# Patient Record
Sex: Female | Born: 1950 | Race: White | Hispanic: No | Marital: Married
Health system: Southern US, Community
[De-identification: ages and names within clinical notes are randomized; demographics above are authoritative.]

## PROBLEM LIST (undated history)

## (undated) DIAGNOSIS — I10 Essential (primary) hypertension: Secondary | ICD-10-CM

## (undated) HISTORY — DX: Essential (primary) hypertension: I10

## (undated) HISTORY — PX: TONSILLECTOMY: SUR1361

---

## 1999-07-27 ENCOUNTER — Encounter (INDEPENDENT_AMBULATORY_CARE_PROVIDER_SITE_OTHER): Payer: Self-pay | Admitting: Specialist

## 1999-07-27 ENCOUNTER — Other Ambulatory Visit: Admission: RE | Admit: 1999-07-27 | Discharge: 1999-07-27 | Payer: Self-pay | Admitting: Obstetrics and Gynecology

## 2001-11-05 ENCOUNTER — Other Ambulatory Visit: Admission: RE | Admit: 2001-11-05 | Discharge: 2001-11-05 | Payer: Self-pay | Admitting: Obstetrics and Gynecology

## 2001-11-30 ENCOUNTER — Encounter: Payer: Self-pay | Admitting: Obstetrics and Gynecology

## 2001-11-30 ENCOUNTER — Encounter: Admission: RE | Admit: 2001-11-30 | Discharge: 2001-11-30 | Payer: Self-pay | Admitting: Obstetrics and Gynecology

## 2002-08-20 ENCOUNTER — Encounter (INDEPENDENT_AMBULATORY_CARE_PROVIDER_SITE_OTHER): Payer: Self-pay | Admitting: *Deleted

## 2002-08-20 ENCOUNTER — Ambulatory Visit (HOSPITAL_COMMUNITY): Admission: RE | Admit: 2002-08-20 | Discharge: 2002-08-20 | Payer: Self-pay | Admitting: Obstetrics and Gynecology

## 2003-01-10 ENCOUNTER — Encounter: Admission: RE | Admit: 2003-01-10 | Discharge: 2003-01-10 | Payer: Self-pay | Admitting: Obstetrics and Gynecology

## 2003-01-10 ENCOUNTER — Encounter: Payer: Self-pay | Admitting: Obstetrics and Gynecology

## 2003-01-17 ENCOUNTER — Other Ambulatory Visit: Admission: RE | Admit: 2003-01-17 | Discharge: 2003-01-17 | Payer: Self-pay | Admitting: Obstetrics and Gynecology

## 2003-02-14 ENCOUNTER — Encounter: Payer: Self-pay | Admitting: Obstetrics and Gynecology

## 2003-02-14 ENCOUNTER — Encounter: Admission: RE | Admit: 2003-02-14 | Discharge: 2003-02-14 | Payer: Self-pay | Admitting: Obstetrics and Gynecology

## 2006-03-27 ENCOUNTER — Other Ambulatory Visit: Admission: RE | Admit: 2006-03-27 | Discharge: 2006-03-27 | Payer: Self-pay | Admitting: Obstetrics and Gynecology

## 2006-04-14 ENCOUNTER — Encounter: Admission: RE | Admit: 2006-04-14 | Discharge: 2006-04-14 | Payer: Self-pay | Admitting: Obstetrics and Gynecology

## 2007-05-22 ENCOUNTER — Other Ambulatory Visit: Admission: RE | Admit: 2007-05-22 | Discharge: 2007-05-22 | Payer: Self-pay | Admitting: Obstetrics and Gynecology

## 2007-07-12 ENCOUNTER — Encounter: Admission: RE | Admit: 2007-07-12 | Discharge: 2007-07-12 | Payer: Self-pay | Admitting: Obstetrics and Gynecology

## 2008-09-09 ENCOUNTER — Other Ambulatory Visit: Admission: RE | Admit: 2008-09-09 | Discharge: 2008-09-09 | Payer: Self-pay | Admitting: Obstetrics and Gynecology

## 2008-11-21 ENCOUNTER — Encounter: Admission: RE | Admit: 2008-11-21 | Discharge: 2008-11-21 | Payer: Self-pay | Admitting: Obstetrics and Gynecology

## 2011-02-11 NOTE — Op Note (Signed)
NAME:  Tamara Edwards, Tamara Edwards                     ACCOUNT NO.:  [id number]   MEDICAL RECORD NO.:  [id number]                   PATIENT TYPE:  AMB   LOCATION:  SDC                                  FACILITY:  [facility]   PHYSICIAN:  Laqueta Linden, M.D.                 DATE OF BIRTH:  02/07/1951   DATE OF PROCEDURE:  08/20/2002  DATE OF DISCHARGE:                                 OPERATIVE REPORT   PREOPERATIVE DIAGNOSES:  Postmenopausal bleeding due to endometrial polyps.   POSTOPERATIVE DIAGNOSES:  Postmenopausal bleeding due to endometrial polyps,  multiple polyps.   PROCEDURE:  Hysteroscopic resection with rollerball ablation.   SURGEON:  Laqueta Linden, M.D.   ANESTHESIA:  General LMA.   ESTIMATED BLOOD LOSS:  Less than 20 cc.   FLUIDS:  Sorbitol net intake 20 cc.   COMPLICATIONS:  None.   INDICATIONS:  The patient is a 60 year old menopausal female initially on  hormone replacement therapy who had the onset of postmenopausal bleeding.  She stopped her hormone replacement therapy and had persistent bleeding.  She underwent pelvic ultrasound with sonohysterogram which revealed one and  possibly two endometrial polyps felt to be the source of her bleeding.  She  was treated with progesterone therapy which was little help.  She has had  intermittently very heavy bleeding and continuous bleeding for several  months.  She is to undergo hysteroscopic resection.  She has seen the  informed consent film, voiced her understanding and acceptance of all risks,  benefits, complications, and recovery expectations and agrees to proceed.   PROCEDURE:  The patient was taken to the operating room and after proper  identification and consents were ascertained, she was placed on the  operating table in supine position.  After the induction of general LMA  anesthesia she was placed in the [place] stirrups and the perineum and vagina  were prepped and draped in a sterile fashion.  A transurethral Foley  was  placed which was removed at the conclusion of the procedure.  Bimanual  examination confirmed a mid plane mobile uterus.  The speculum was placed in  the vagina and the cervix grasped with a single tooth tenaculum.  The  internal os was patent to a sound and was sounded to 9 cm.  The internal os  was then gently dilated to a number 33 Pratt dilator.  The resectoscope with  continuous sorbitol infusion was then inserted under direct video  observation.  The endocervical canal was free of lesions.  The endometrial  cavity was notable for a multitude of polyps.  There was one large broad  based polyp extending down from the fundus.  There were multiple clusters of  polyps on the anterior right uterine wall as well as the left posterior  uterine wall.  Photographs were taken for pre resection confirmation of  findings.  The resectoscope with double loop was then placed on routine  settings and all of the above mentioned polyps were resected.  The remainder  of the endometrial cavity was resected as well as there were multiple  irregularities suspicious for additional polyps.  There were several small  bleeding points noted.  The rollerball attachment was then attached and the  entire cavity was then ablated in an effort to decrease postoperative  bleeding as well as prevent reformation of polyps.  Post rollerball  photograph was taken as well.  All instruments and tissue pieces were  evacuated.  The tissue was sent to pathology.  The tenaculum site was  hemostatic.  There was no bleeding from the cervix.  Net sorbitol intake was  less than 20 cc.  Estimated blood loss less than 20 cc.  The patient was  stable on transfer to the recovery room.  She received Toradol 30 mg IV, 30  mg IM prior to conclusion of the procedure.  She will be observed and  discharged per anesthesia protocol.  She has routine verbal and written  discharge instructions.  She was to take Advil or Aleve as needed for   cramping and continue her Prometrium daily for the next week and then stop  this.  She is scheduled to follow up in my office in five weeks' time.  She  is to call prior to that time for excessive pain, fever, bleeding, abdominal  complaints, or any other concerns.                                               Laqueta Linden, M.D.    LKS/MEDQ  D:  08/20/2002  T:  08/20/2002  Job:  [id number]

## 2013-07-05 ENCOUNTER — Other Ambulatory Visit: Payer: Self-pay

## 2013-07-05 DIAGNOSIS — Z1231 Encounter for screening mammogram for malignant neoplasm of breast: Secondary | ICD-10-CM

## 2013-07-29 ENCOUNTER — Ambulatory Visit
Admission: RE | Admit: 2013-07-29 | Discharge: 2013-07-29 | Disposition: A | Discharge status: Home / Self Care | Payer: No Typology Code available for payment source | Source: Ambulatory Visit

## 2013-07-29 DIAGNOSIS — Z1231 Encounter for screening mammogram for malignant neoplasm of breast: Secondary | ICD-10-CM

## 2016-02-02 DIAGNOSIS — H5213 Myopia, bilateral: Secondary | ICD-10-CM | POA: Diagnosis not present

## 2016-02-02 DIAGNOSIS — H2513 Age-related nuclear cataract, bilateral: Secondary | ICD-10-CM | POA: Diagnosis not present

## 2016-02-02 DIAGNOSIS — H353122 Nonexudative age-related macular degeneration, left eye, intermediate dry stage: Secondary | ICD-10-CM | POA: Diagnosis not present

## 2016-02-04 DIAGNOSIS — I10 Essential (primary) hypertension: Secondary | ICD-10-CM | POA: Diagnosis not present

## 2016-02-04 DIAGNOSIS — K219 Gastro-esophageal reflux disease without esophagitis: Secondary | ICD-10-CM | POA: Diagnosis not present

## 2016-02-04 DIAGNOSIS — F419 Anxiety disorder, unspecified: Secondary | ICD-10-CM | POA: Diagnosis not present

## 2016-02-04 DIAGNOSIS — E7800 Pure hypercholesterolemia, unspecified: Secondary | ICD-10-CM | POA: Diagnosis not present

## 2016-06-30 DIAGNOSIS — Z23 Encounter for immunization: Secondary | ICD-10-CM | POA: Diagnosis not present

## 2016-08-10 DIAGNOSIS — I10 Essential (primary) hypertension: Secondary | ICD-10-CM | POA: Diagnosis not present

## 2016-08-10 DIAGNOSIS — E7800 Pure hypercholesterolemia, unspecified: Secondary | ICD-10-CM | POA: Diagnosis not present

## 2016-08-10 DIAGNOSIS — F419 Anxiety disorder, unspecified: Secondary | ICD-10-CM | POA: Diagnosis not present

## 2016-08-10 DIAGNOSIS — K219 Gastro-esophageal reflux disease without esophagitis: Secondary | ICD-10-CM | POA: Diagnosis not present

## 2017-02-06 DIAGNOSIS — H524 Presbyopia: Secondary | ICD-10-CM | POA: Diagnosis not present

## 2017-02-17 DIAGNOSIS — I10 Essential (primary) hypertension: Secondary | ICD-10-CM | POA: Diagnosis not present

## 2017-02-17 DIAGNOSIS — F419 Anxiety disorder, unspecified: Secondary | ICD-10-CM | POA: Diagnosis not present

## 2017-02-17 DIAGNOSIS — E7800 Pure hypercholesterolemia, unspecified: Secondary | ICD-10-CM | POA: Diagnosis not present

## 2017-02-17 DIAGNOSIS — Z1159 Encounter for screening for other viral diseases: Secondary | ICD-10-CM | POA: Diagnosis not present

## 2017-02-17 DIAGNOSIS — K219 Gastro-esophageal reflux disease without esophagitis: Secondary | ICD-10-CM | POA: Diagnosis not present

## 2017-06-26 DIAGNOSIS — H3561 Retinal hemorrhage, right eye: Secondary | ICD-10-CM | POA: Diagnosis not present

## 2017-06-26 DIAGNOSIS — H35031 Hypertensive retinopathy, right eye: Secondary | ICD-10-CM | POA: Diagnosis not present

## 2017-06-26 DIAGNOSIS — H353122 Nonexudative age-related macular degeneration, left eye, intermediate dry stage: Secondary | ICD-10-CM | POA: Diagnosis not present

## 2017-08-02 DIAGNOSIS — Z23 Encounter for immunization: Secondary | ICD-10-CM | POA: Diagnosis not present

## 2017-09-13 DIAGNOSIS — E7800 Pure hypercholesterolemia, unspecified: Secondary | ICD-10-CM | POA: Diagnosis not present

## 2017-09-13 DIAGNOSIS — F419 Anxiety disorder, unspecified: Secondary | ICD-10-CM | POA: Diagnosis not present

## 2017-09-13 DIAGNOSIS — K219 Gastro-esophageal reflux disease without esophagitis: Secondary | ICD-10-CM | POA: Diagnosis not present

## 2017-09-13 DIAGNOSIS — I10 Essential (primary) hypertension: Secondary | ICD-10-CM | POA: Diagnosis not present

## 2017-09-13 DIAGNOSIS — Z1211 Encounter for screening for malignant neoplasm of colon: Secondary | ICD-10-CM | POA: Diagnosis not present

## 2018-02-28 ENCOUNTER — Other Ambulatory Visit: Payer: Self-pay | Admitting: Family Medicine

## 2018-02-28 DIAGNOSIS — Z1231 Encounter for screening mammogram for malignant neoplasm of breast: Secondary | ICD-10-CM

## 2018-03-21 ENCOUNTER — Encounter: Payer: Self-pay | Admitting: Radiology

## 2018-03-21 ENCOUNTER — Ambulatory Visit
Admission: RE | Admit: 2018-03-21 | Discharge: 2018-03-21 | Disposition: A | Discharge status: Home / Self Care | Payer: Medicare Other | Source: Ambulatory Visit | Attending: Family Medicine | Admitting: Family Medicine

## 2018-03-21 DIAGNOSIS — Z1231 Encounter for screening mammogram for malignant neoplasm of breast: Secondary | ICD-10-CM | POA: Diagnosis not present

## 2018-04-25 DIAGNOSIS — E7800 Pure hypercholesterolemia, unspecified: Secondary | ICD-10-CM | POA: Diagnosis not present

## 2018-04-25 DIAGNOSIS — F419 Anxiety disorder, unspecified: Secondary | ICD-10-CM | POA: Diagnosis not present

## 2018-04-25 DIAGNOSIS — I10 Essential (primary) hypertension: Secondary | ICD-10-CM | POA: Diagnosis not present

## 2018-04-25 DIAGNOSIS — K219 Gastro-esophageal reflux disease without esophagitis: Secondary | ICD-10-CM | POA: Diagnosis not present

## 2018-07-18 DIAGNOSIS — Z23 Encounter for immunization: Secondary | ICD-10-CM | POA: Diagnosis not present

## 2018-10-30 DIAGNOSIS — I10 Essential (primary) hypertension: Secondary | ICD-10-CM | POA: Diagnosis not present

## 2018-10-30 DIAGNOSIS — E7800 Pure hypercholesterolemia, unspecified: Secondary | ICD-10-CM | POA: Diagnosis not present

## 2018-10-30 DIAGNOSIS — F411 Generalized anxiety disorder: Secondary | ICD-10-CM | POA: Diagnosis not present

## 2019-05-21 DIAGNOSIS — E7800 Pure hypercholesterolemia, unspecified: Secondary | ICD-10-CM | POA: Diagnosis not present

## 2019-05-21 DIAGNOSIS — I10 Essential (primary) hypertension: Secondary | ICD-10-CM | POA: Diagnosis not present

## 2019-05-21 DIAGNOSIS — F419 Anxiety disorder, unspecified: Secondary | ICD-10-CM | POA: Diagnosis not present

## 2019-05-21 DIAGNOSIS — Z1211 Encounter for screening for malignant neoplasm of colon: Secondary | ICD-10-CM | POA: Diagnosis not present

## 2019-06-25 DIAGNOSIS — Z23 Encounter for immunization: Secondary | ICD-10-CM | POA: Diagnosis not present

## 2019-07-31 DIAGNOSIS — H2513 Age-related nuclear cataract, bilateral: Secondary | ICD-10-CM | POA: Diagnosis not present

## 2019-07-31 DIAGNOSIS — H35722 Serous detachment of retinal pigment epithelium, left eye: Secondary | ICD-10-CM | POA: Diagnosis not present

## 2019-07-31 DIAGNOSIS — H524 Presbyopia: Secondary | ICD-10-CM | POA: Diagnosis not present

## 2019-09-04 DIAGNOSIS — H25813 Combined forms of age-related cataract, bilateral: Secondary | ICD-10-CM | POA: Diagnosis not present

## 2019-09-04 DIAGNOSIS — H02831 Dermatochalasis of right upper eyelid: Secondary | ICD-10-CM | POA: Diagnosis not present

## 2019-09-04 DIAGNOSIS — H35342 Macular cyst, hole, or pseudohole, left eye: Secondary | ICD-10-CM | POA: Diagnosis not present

## 2019-09-04 DIAGNOSIS — H52203 Unspecified astigmatism, bilateral: Secondary | ICD-10-CM | POA: Diagnosis not present

## 2019-09-14 DIAGNOSIS — Z01818 Encounter for other preprocedural examination: Secondary | ICD-10-CM | POA: Diagnosis not present

## 2019-09-17 DIAGNOSIS — H25813 Combined forms of age-related cataract, bilateral: Secondary | ICD-10-CM | POA: Diagnosis not present

## 2019-09-17 DIAGNOSIS — E785 Hyperlipidemia, unspecified: Secondary | ICD-10-CM | POA: Diagnosis not present

## 2019-09-17 DIAGNOSIS — H25811 Combined forms of age-related cataract, right eye: Secondary | ICD-10-CM | POA: Diagnosis not present

## 2019-09-17 DIAGNOSIS — Z83511 Family history of glaucoma: Secondary | ICD-10-CM | POA: Diagnosis not present

## 2019-09-17 DIAGNOSIS — I10 Essential (primary) hypertension: Secondary | ICD-10-CM | POA: Diagnosis not present

## 2019-09-21 DIAGNOSIS — Z01818 Encounter for other preprocedural examination: Secondary | ICD-10-CM | POA: Diagnosis not present

## 2019-09-24 DIAGNOSIS — E785 Hyperlipidemia, unspecified: Secondary | ICD-10-CM | POA: Diagnosis not present

## 2019-09-24 DIAGNOSIS — H25812 Combined forms of age-related cataract, left eye: Secondary | ICD-10-CM | POA: Diagnosis not present

## 2019-09-24 DIAGNOSIS — H25813 Combined forms of age-related cataract, bilateral: Secondary | ICD-10-CM | POA: Diagnosis not present

## 2019-09-24 DIAGNOSIS — I10 Essential (primary) hypertension: Secondary | ICD-10-CM | POA: Diagnosis not present

## 2019-09-24 DIAGNOSIS — K219 Gastro-esophageal reflux disease without esophagitis: Secondary | ICD-10-CM | POA: Diagnosis not present

## 2019-10-18 DIAGNOSIS — H59031 Cystoid macular edema following cataract surgery, right eye: Secondary | ICD-10-CM | POA: Diagnosis not present

## 2019-10-18 DIAGNOSIS — Z961 Presence of intraocular lens: Secondary | ICD-10-CM | POA: Diagnosis not present

## 2019-11-17 ENCOUNTER — Ambulatory Visit: Payer: Medicare Other | Attending: Internal Medicine

## 2019-11-17 DIAGNOSIS — Z23 Encounter for immunization: Secondary | ICD-10-CM | POA: Insufficient documentation

## 2019-11-17 NOTE — Progress Notes (Signed)
   Covid-19 Vaccination Clinic  Name:  SHANTINIQUE PICAZO    MRN: [id number] DOB: 1951-05-06  11/17/2019  Ms. Albin was observed post Covid-19 immunization for 15 minutes without incidence. She was provided with Vaccine Information Sheet and instruction to access the V-Safe system.   Ms. Fons was instructed to call 911 with any severe reactions post vaccine: Marland Kitchen Difficulty breathing  . Swelling of your face and throat  . A fast heartbeat  . A bad rash all over your body  . Dizziness and weakness    Immunizations Administered    Name Date Dose VIS Date Route   Pfizer COVID-19 Vaccine 11/17/2019 10:01 AM 0.3 mL 09/06/2019 Intramuscular   Manufacturer: [employer], [employer]   Lot: [id number]   NDC: [address]-1

## 2019-11-21 DIAGNOSIS — Z0000 Encounter for general adult medical examination without abnormal findings: Secondary | ICD-10-CM | POA: Diagnosis not present

## 2019-11-21 DIAGNOSIS — E7800 Pure hypercholesterolemia, unspecified: Secondary | ICD-10-CM | POA: Diagnosis not present

## 2019-11-21 DIAGNOSIS — F419 Anxiety disorder, unspecified: Secondary | ICD-10-CM | POA: Diagnosis not present

## 2019-11-21 DIAGNOSIS — I10 Essential (primary) hypertension: Secondary | ICD-10-CM | POA: Diagnosis not present

## 2019-11-25 DIAGNOSIS — H35341 Macular cyst, hole, or pseudohole, right eye: Secondary | ICD-10-CM | POA: Diagnosis not present

## 2019-11-28 ENCOUNTER — Other Ambulatory Visit: Payer: Self-pay | Admitting: Physician Assistant

## 2019-11-28 DIAGNOSIS — Z1231 Encounter for screening mammogram for malignant neoplasm of breast: Secondary | ICD-10-CM

## 2019-11-28 DIAGNOSIS — Z13820 Encounter for screening for osteoporosis: Secondary | ICD-10-CM

## 2019-12-11 ENCOUNTER — Ambulatory Visit: Payer: Medicare Other | Attending: Internal Medicine

## 2019-12-11 DIAGNOSIS — Z23 Encounter for immunization: Secondary | ICD-10-CM

## 2019-12-11 NOTE — Progress Notes (Signed)
   Covid-19 Vaccination Clinic  Name:  JULISA FLIPPO    MRN: [id number] DOB: 09/30/1950  12/11/2019  Ms. Lovett was observed post Covid-19 immunization for 15 minutes without incident. She was provided with Vaccine Information Sheet and instruction to access the V-Safe system.   Ms. Saville was instructed to call 911 with any severe reactions post vaccine: Marland Kitchen Difficulty breathing  . Swelling of face and throat  . A fast heartbeat  . A bad rash all over body  . Dizziness and weakness   Immunizations Administered    Name Date Dose VIS Date Route   Pfizer COVID-19 Vaccine 12/11/2019  9:56 AM 0.3 mL 09/06/2019 Intramuscular   Manufacturer: [employer], [employer]   Lot: [id number]   NDC: [address]-2

## 2019-12-25 DIAGNOSIS — Z0000 Encounter for general adult medical examination without abnormal findings: Secondary | ICD-10-CM | POA: Diagnosis not present

## 2020-03-10 ENCOUNTER — Ambulatory Visit
Admission: RE | Admit: 2020-03-10 | Discharge: 2020-03-10 | Disposition: A | Discharge status: Home / Self Care | Payer: Medicare Other | Source: Ambulatory Visit | Attending: Physician Assistant | Admitting: Physician Assistant

## 2020-03-10 ENCOUNTER — Other Ambulatory Visit: Payer: Self-pay

## 2020-03-10 DIAGNOSIS — M8589 Other specified disorders of bone density and structure, multiple sites: Secondary | ICD-10-CM | POA: Diagnosis not present

## 2020-03-10 DIAGNOSIS — Z13820 Encounter for screening for osteoporosis: Secondary | ICD-10-CM

## 2020-03-10 DIAGNOSIS — Z1231 Encounter for screening mammogram for malignant neoplasm of breast: Secondary | ICD-10-CM

## 2020-03-10 DIAGNOSIS — Z780 Asymptomatic menopausal state: Secondary | ICD-10-CM | POA: Diagnosis not present

## 2020-07-06 DIAGNOSIS — Z23 Encounter for immunization: Secondary | ICD-10-CM | POA: Diagnosis not present

## 2020-12-15 DIAGNOSIS — I10 Essential (primary) hypertension: Secondary | ICD-10-CM | POA: Diagnosis not present

## 2020-12-15 DIAGNOSIS — E7800 Pure hypercholesterolemia, unspecified: Secondary | ICD-10-CM | POA: Diagnosis not present

## 2021-04-07 IMAGING — MG DIGITAL SCREENING BILAT W/ TOMO W/ CAD
8 series · 8 of 24 positions shown · non-contrast
Comparison: Previous exam(s).

CLINICAL DATA: Screening.

EXAM:
DIGITAL SCREENING BILATERAL MAMMOGRAM WITH TOMO AND CAD

[R CC synth-2D]
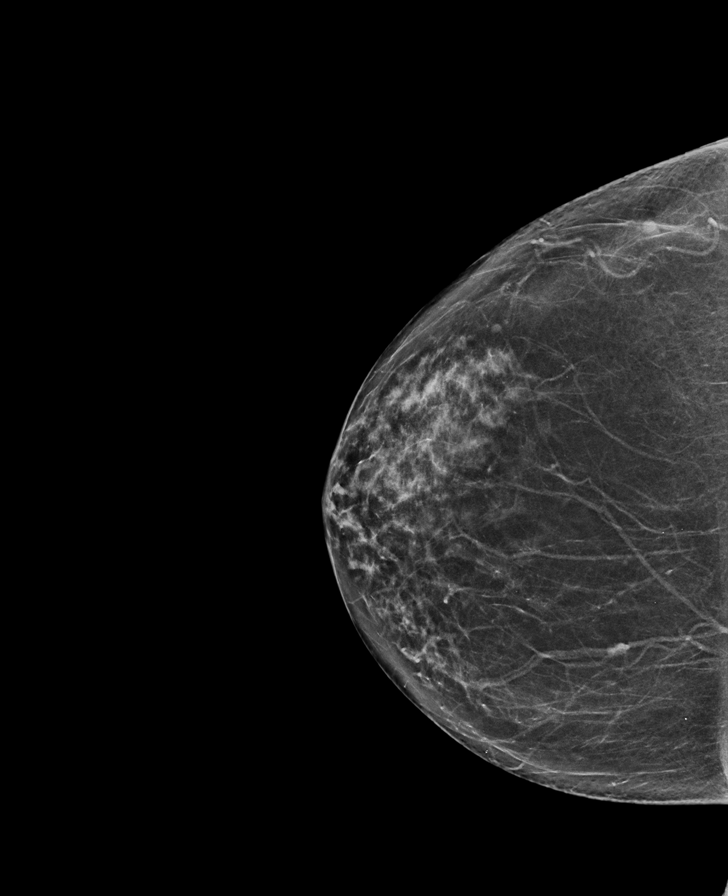

[L MLO synth-2D]
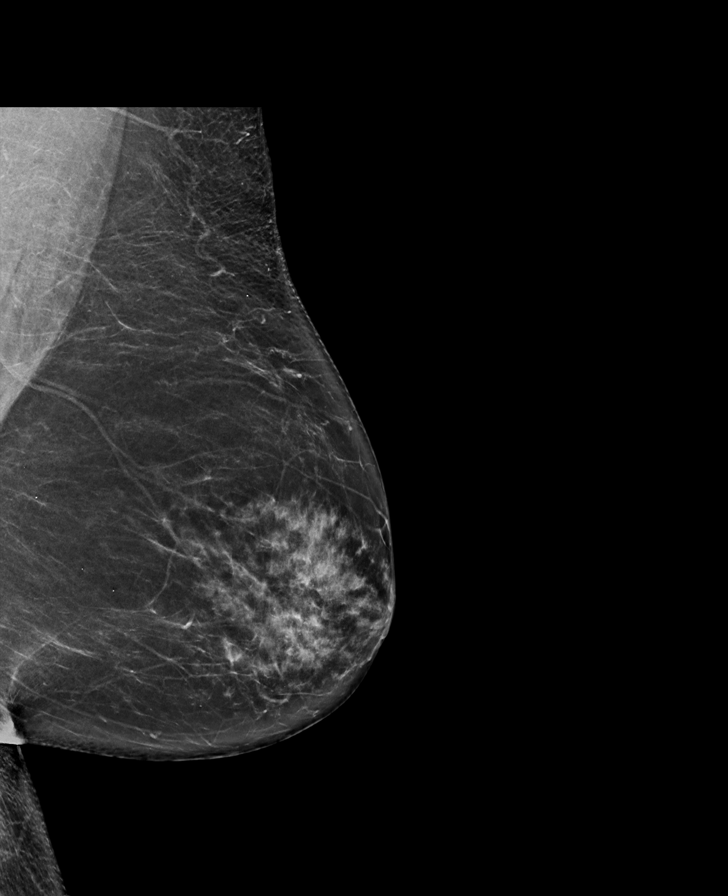

[L CC synth-2D]
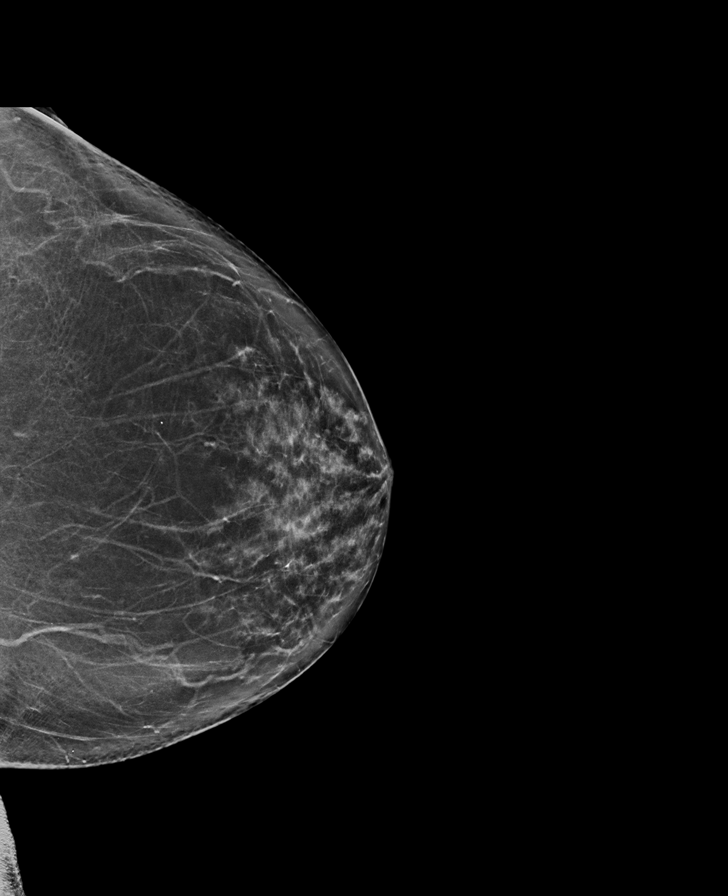

[R MLO synth-2D]
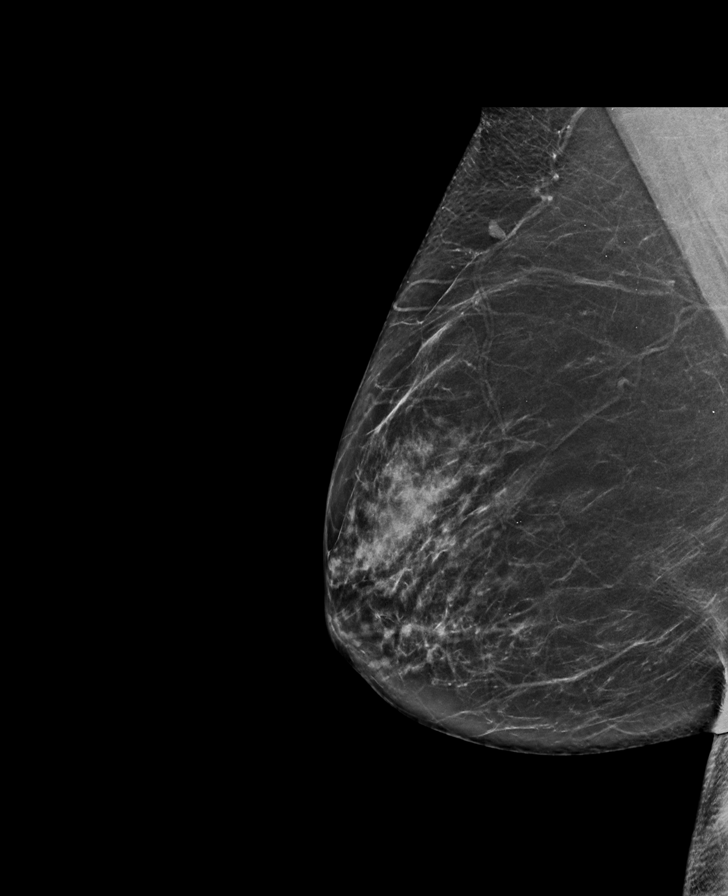

[R CC tomo · tomo slice 35/68.0]
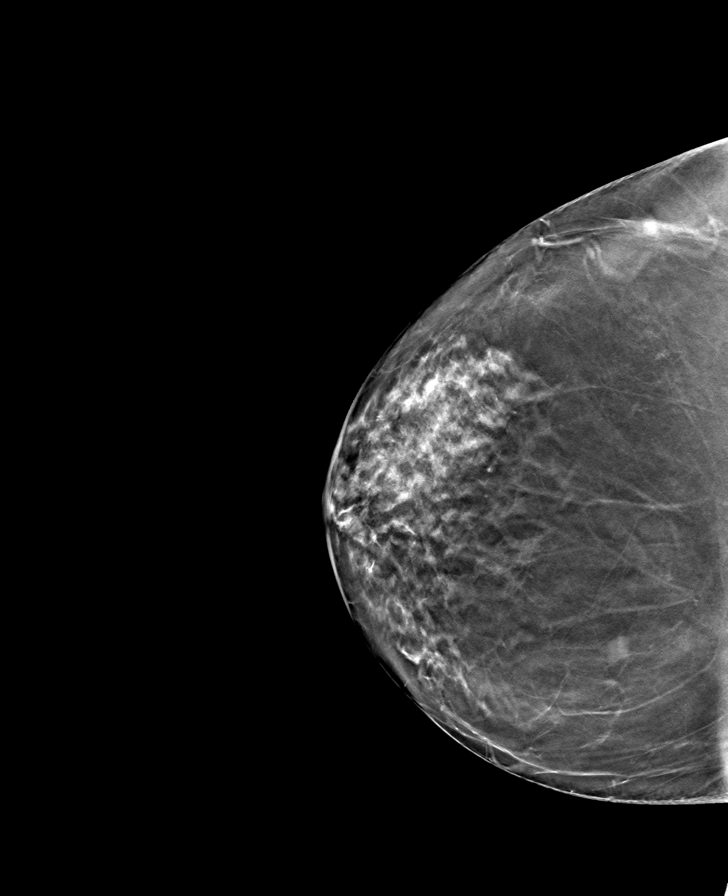

[R MLO tomo · tomo slice 37/74.0]
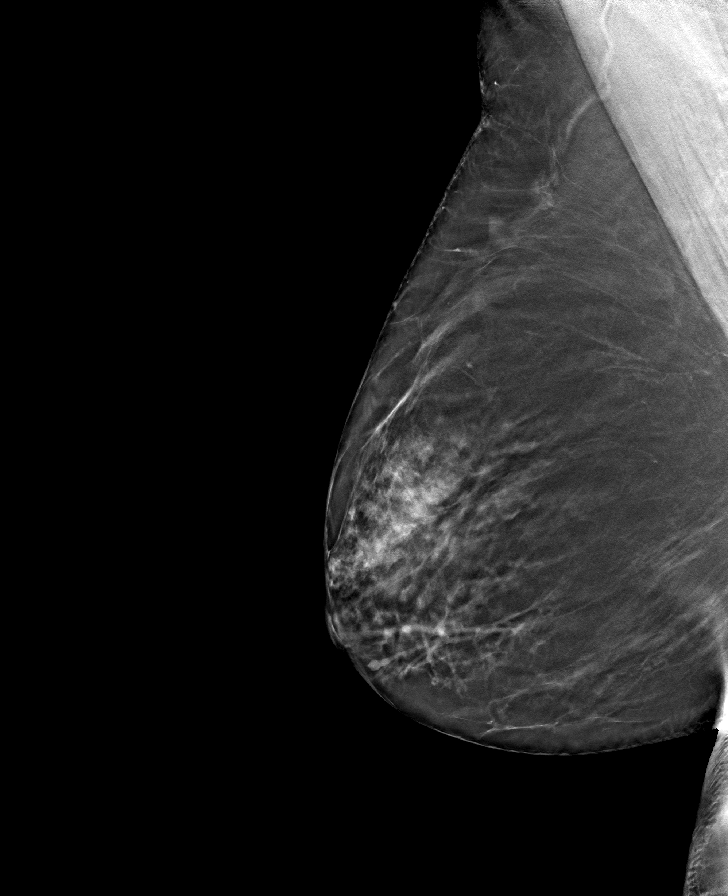

[L CC tomo · tomo slice 37/73.0]
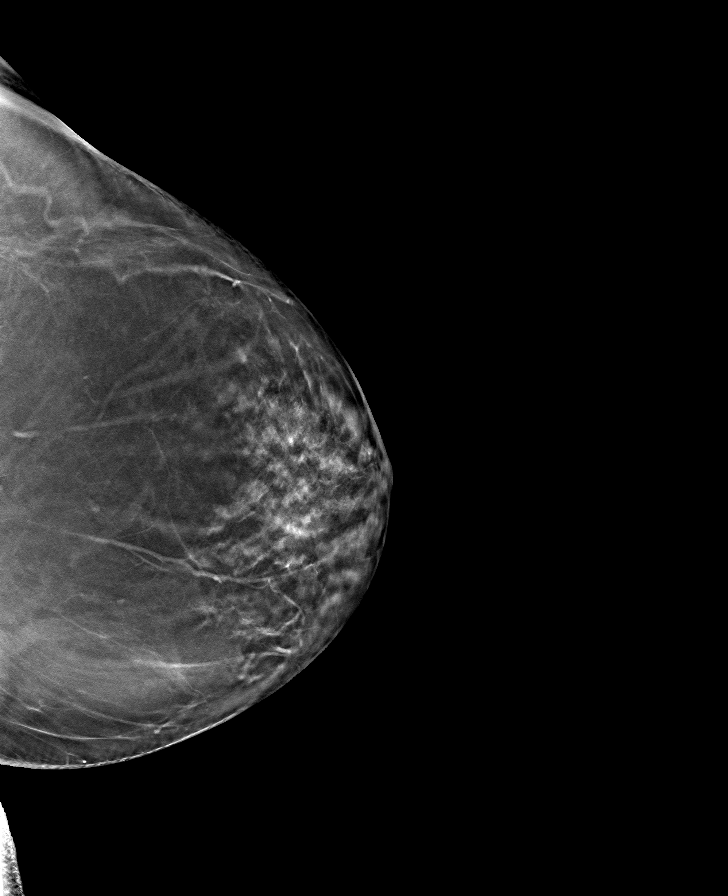

[L MLO tomo · tomo slice 38/75.0]
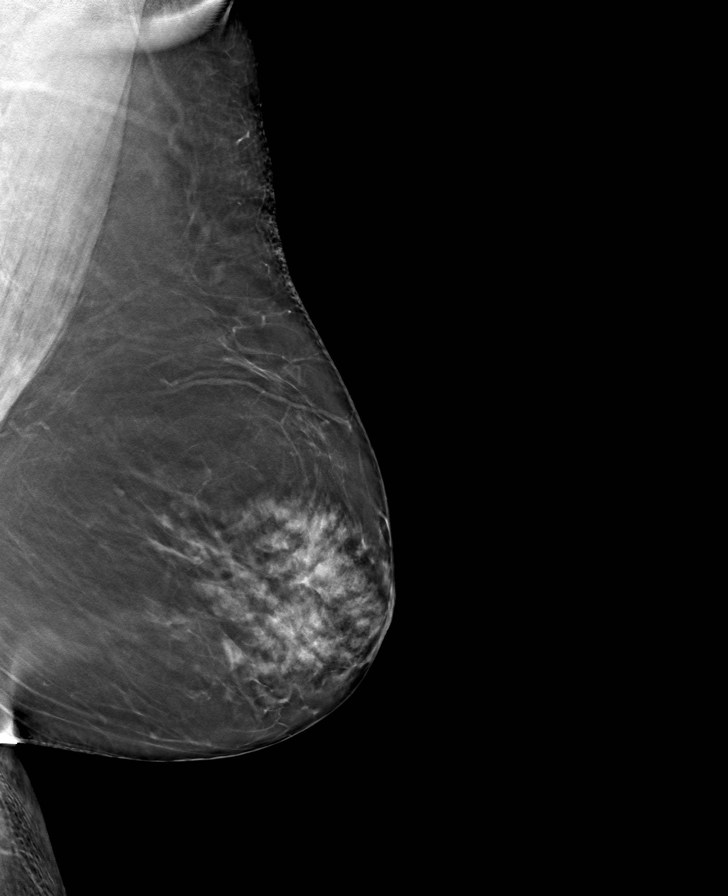

[8 of 24 positions shown; findings below may reference images not displayed]

ACR Breast Density Category c: The breast tissue is heterogeneously
dense, which may obscure small masses.
FINDINGS: There are no findings suspicious for malignancy. Images were
processed with CAD.
IMPRESSION: No mammographic evidence of malignancy. A result letter of this
screening mammogram will be mailed directly to the patient.

RECOMMENDATION:
Screening mammogram in one year. (Code:[id number])

BI-RADS CATEGORY  1: Negative.

## 2021-05-11 DIAGNOSIS — I10 Essential (primary) hypertension: Secondary | ICD-10-CM | POA: Diagnosis not present

## 2021-06-10 DIAGNOSIS — F411 Generalized anxiety disorder: Secondary | ICD-10-CM | POA: Diagnosis not present

## 2021-06-10 DIAGNOSIS — I10 Essential (primary) hypertension: Secondary | ICD-10-CM | POA: Diagnosis not present

## 2021-06-10 DIAGNOSIS — Z1211 Encounter for screening for malignant neoplasm of colon: Secondary | ICD-10-CM | POA: Diagnosis not present

## 2021-06-24 DIAGNOSIS — Z1211 Encounter for screening for malignant neoplasm of colon: Secondary | ICD-10-CM | POA: Diagnosis not present

## 2021-07-05 DIAGNOSIS — Z23 Encounter for immunization: Secondary | ICD-10-CM | POA: Diagnosis not present

## 2021-12-10 DIAGNOSIS — F419 Anxiety disorder, unspecified: Secondary | ICD-10-CM | POA: Diagnosis not present

## 2021-12-10 DIAGNOSIS — I10 Essential (primary) hypertension: Secondary | ICD-10-CM | POA: Diagnosis not present

## 2022-06-02 DIAGNOSIS — Z13820 Encounter for screening for osteoporosis: Secondary | ICD-10-CM | POA: Diagnosis not present

## 2022-06-02 DIAGNOSIS — Z0000 Encounter for general adult medical examination without abnormal findings: Secondary | ICD-10-CM | POA: Diagnosis not present

## 2022-06-02 DIAGNOSIS — E7800 Pure hypercholesterolemia, unspecified: Secondary | ICD-10-CM | POA: Diagnosis not present

## 2022-06-02 DIAGNOSIS — I10 Essential (primary) hypertension: Secondary | ICD-10-CM | POA: Diagnosis not present

## 2022-06-06 ENCOUNTER — Other Ambulatory Visit: Payer: Self-pay | Admitting: Family Medicine

## 2022-06-06 DIAGNOSIS — Z13820 Encounter for screening for osteoporosis: Secondary | ICD-10-CM

## 2022-06-16 DIAGNOSIS — E7800 Pure hypercholesterolemia, unspecified: Secondary | ICD-10-CM | POA: Diagnosis not present

## 2022-06-16 DIAGNOSIS — I10 Essential (primary) hypertension: Secondary | ICD-10-CM | POA: Diagnosis not present

## 2022-06-16 DIAGNOSIS — Z23 Encounter for immunization: Secondary | ICD-10-CM | POA: Diagnosis not present

## 2022-06-29 ENCOUNTER — Other Ambulatory Visit: Payer: Self-pay | Admitting: Family Medicine

## 2022-06-29 DIAGNOSIS — Z1211 Encounter for screening for malignant neoplasm of colon: Secondary | ICD-10-CM | POA: Diagnosis not present

## 2022-06-29 DIAGNOSIS — Z1231 Encounter for screening mammogram for malignant neoplasm of breast: Secondary | ICD-10-CM

## 2022-06-29 DIAGNOSIS — M25512 Pain in left shoulder: Secondary | ICD-10-CM | POA: Diagnosis not present

## 2022-07-08 ENCOUNTER — Other Ambulatory Visit: Payer: Self-pay | Admitting: Family Medicine

## 2022-07-08 ENCOUNTER — Ambulatory Visit (INDEPENDENT_AMBULATORY_CARE_PROVIDER_SITE_OTHER): Payer: Medicare Other

## 2022-07-08 DIAGNOSIS — M25512 Pain in left shoulder: Secondary | ICD-10-CM

## 2022-07-08 DIAGNOSIS — Z13820 Encounter for screening for osteoporosis: Secondary | ICD-10-CM

## 2022-07-08 DIAGNOSIS — Z1231 Encounter for screening mammogram for malignant neoplasm of breast: Secondary | ICD-10-CM

## 2022-07-20 ENCOUNTER — Ambulatory Visit (INDEPENDENT_AMBULATORY_CARE_PROVIDER_SITE_OTHER): Payer: Medicare Other

## 2022-07-20 DIAGNOSIS — M8589 Other specified disorders of bone density and structure, multiple sites: Secondary | ICD-10-CM | POA: Diagnosis not present

## 2022-07-20 DIAGNOSIS — Z13820 Encounter for screening for osteoporosis: Secondary | ICD-10-CM | POA: Diagnosis not present

## 2022-07-20 DIAGNOSIS — Z1231 Encounter for screening mammogram for malignant neoplasm of breast: Secondary | ICD-10-CM

## 2022-07-20 DIAGNOSIS — Z780 Asymptomatic menopausal state: Secondary | ICD-10-CM | POA: Diagnosis not present

## 2022-08-04 DIAGNOSIS — Z1212 Encounter for screening for malignant neoplasm of rectum: Secondary | ICD-10-CM | POA: Diagnosis not present

## 2022-08-04 DIAGNOSIS — Z1211 Encounter for screening for malignant neoplasm of colon: Secondary | ICD-10-CM | POA: Diagnosis not present

## 2022-11-24 ENCOUNTER — Other Ambulatory Visit: Payer: Medicare Other

## 2022-11-24 ENCOUNTER — Ambulatory Visit: Payer: Medicare Other

## 2022-12-05 DIAGNOSIS — R059 Cough, unspecified: Secondary | ICD-10-CM | POA: Diagnosis not present

## 2023-04-05 DIAGNOSIS — R42 Dizziness and giddiness: Secondary | ICD-10-CM | POA: Diagnosis not present

## 2023-04-05 DIAGNOSIS — I10 Essential (primary) hypertension: Secondary | ICD-10-CM | POA: Diagnosis not present

## 2023-06-12 DIAGNOSIS — H6121 Impacted cerumen, right ear: Secondary | ICD-10-CM | POA: Diagnosis not present

## 2023-06-12 DIAGNOSIS — E7800 Pure hypercholesterolemia, unspecified: Secondary | ICD-10-CM | POA: Diagnosis not present

## 2023-06-12 DIAGNOSIS — I10 Essential (primary) hypertension: Secondary | ICD-10-CM | POA: Diagnosis not present

## 2023-06-12 DIAGNOSIS — Z0000 Encounter for general adult medical examination without abnormal findings: Secondary | ICD-10-CM | POA: Diagnosis not present

## 2023-07-13 DIAGNOSIS — Z23 Encounter for immunization: Secondary | ICD-10-CM | POA: Diagnosis not present

## 2023-08-04 DIAGNOSIS — L282 Other prurigo: Secondary | ICD-10-CM | POA: Diagnosis not present

## 2024-05-01 DIAGNOSIS — H26493 Other secondary cataract, bilateral: Secondary | ICD-10-CM | POA: Diagnosis not present

## 2024-05-09 DIAGNOSIS — R42 Dizziness and giddiness: Secondary | ICD-10-CM | POA: Diagnosis not present

## 2024-05-09 DIAGNOSIS — R569 Unspecified convulsions: Secondary | ICD-10-CM | POA: Diagnosis not present

## 2024-05-09 DIAGNOSIS — R61 Generalized hyperhidrosis: Secondary | ICD-10-CM | POA: Diagnosis not present

## 2024-05-09 DIAGNOSIS — E871 Hypo-osmolality and hyponatremia: Secondary | ICD-10-CM | POA: Diagnosis not present

## 2024-05-09 DIAGNOSIS — I493 Ventricular premature depolarization: Secondary | ICD-10-CM | POA: Diagnosis not present

## 2024-05-09 DIAGNOSIS — S0990XA Unspecified injury of head, initial encounter: Secondary | ICD-10-CM | POA: Diagnosis not present

## 2024-05-09 DIAGNOSIS — R55 Syncope and collapse: Secondary | ICD-10-CM | POA: Diagnosis not present

## 2024-05-09 DIAGNOSIS — R404 Transient alteration of awareness: Secondary | ICD-10-CM | POA: Diagnosis not present

## 2024-05-17 DIAGNOSIS — R55 Syncope and collapse: Secondary | ICD-10-CM | POA: Diagnosis not present

## 2024-05-17 DIAGNOSIS — E871 Hypo-osmolality and hyponatremia: Secondary | ICD-10-CM | POA: Diagnosis not present

## 2024-05-30 DIAGNOSIS — H02831 Dermatochalasis of right upper eyelid: Secondary | ICD-10-CM | POA: Diagnosis not present

## 2024-05-30 DIAGNOSIS — H26493 Other secondary cataract, bilateral: Secondary | ICD-10-CM | POA: Diagnosis not present

## 2024-05-30 DIAGNOSIS — H02834 Dermatochalasis of left upper eyelid: Secondary | ICD-10-CM | POA: Diagnosis not present

## 2024-05-30 DIAGNOSIS — H35341 Macular cyst, hole, or pseudohole, right eye: Secondary | ICD-10-CM | POA: Diagnosis not present

## 2024-05-30 DIAGNOSIS — H526 Other disorders of refraction: Secondary | ICD-10-CM | POA: Diagnosis not present

## 2024-06-03 DIAGNOSIS — R42 Dizziness and giddiness: Secondary | ICD-10-CM | POA: Diagnosis not present

## 2024-06-03 DIAGNOSIS — I10 Essential (primary) hypertension: Secondary | ICD-10-CM | POA: Diagnosis not present

## 2024-06-03 DIAGNOSIS — E871 Hypo-osmolality and hyponatremia: Secondary | ICD-10-CM | POA: Diagnosis not present

## 2024-06-10 DIAGNOSIS — H26491 Other secondary cataract, right eye: Secondary | ICD-10-CM | POA: Diagnosis not present

## 2024-06-20 DIAGNOSIS — E7800 Pure hypercholesterolemia, unspecified: Secondary | ICD-10-CM | POA: Diagnosis not present

## 2024-06-20 DIAGNOSIS — Z23 Encounter for immunization: Secondary | ICD-10-CM | POA: Diagnosis not present

## 2024-06-20 DIAGNOSIS — Z1331 Encounter for screening for depression: Secondary | ICD-10-CM | POA: Diagnosis not present

## 2024-06-20 DIAGNOSIS — Z87898 Personal history of other specified conditions: Secondary | ICD-10-CM | POA: Diagnosis not present

## 2024-06-20 DIAGNOSIS — I10 Essential (primary) hypertension: Secondary | ICD-10-CM | POA: Diagnosis not present

## 2024-06-20 DIAGNOSIS — F411 Generalized anxiety disorder: Secondary | ICD-10-CM | POA: Diagnosis not present

## 2024-06-20 DIAGNOSIS — Z0000 Encounter for general adult medical examination without abnormal findings: Secondary | ICD-10-CM | POA: Diagnosis not present

## 2024-06-20 DIAGNOSIS — K219 Gastro-esophageal reflux disease without esophagitis: Secondary | ICD-10-CM | POA: Diagnosis not present

## 2024-07-02 ENCOUNTER — Other Ambulatory Visit: Payer: Self-pay | Admitting: Physician Assistant

## 2024-07-02 DIAGNOSIS — Z1231 Encounter for screening mammogram for malignant neoplasm of breast: Secondary | ICD-10-CM

## 2024-07-18 ENCOUNTER — Ambulatory Visit

## 2024-07-18 DIAGNOSIS — Z1231 Encounter for screening mammogram for malignant neoplasm of breast: Secondary | ICD-10-CM | POA: Diagnosis not present

## 2024-08-14 NOTE — Progress Notes (Signed)
 Tamara Senters, MD Reason for referral-syncope  HPI: 73 year old female for evaluation of syncope at request of Tamara Senters, MD.  Seen at [facility] August 2025 with a syncopal episode.  Troponins were normal.  Hemoglobin 13, sodium 129, D-dimer elevated at 1.36.  CTA showed no pulmonary embolus; no coronary calcification noted.  By report ECG showed sinus tachycardia with frequent and consecutive PVCs, right axis, ECG not available for my review.  Cardiology now asked to evaluate.  Patient states that in the last 6 months she has had occasions where she feels dizzy when she stands up suddenly.  On the day of her event she was shopping and had been standing for at least 20 minutes.  She developed diaphoresis and mild nausea followed by frank syncope.  No preceding dyspnea, chest pain or palpitations.  No incontinence.  Was unconscious very briefly.  She has had no frank syncope since that time.  She otherwise has not had exertional chest pain or dyspnea on exertion.  Current Outpatient Medications  Medication Sig Dispense Refill   amLODipine (NORVASC) 10 MG tablet Take 10 mg by mouth daily.     atorvastatin (LIPITOR) 20 MG tablet Take 20 mg by mouth daily.     lisinopril (ZESTRIL) 20 MG tablet Take 20 mg by mouth daily.     sertraline (ZOLOFT) 50 MG tablet Take 50 mg by mouth daily.     No current facility-administered medications for this visit.    Allergies  Allergen Reactions   Sulfa Antibiotics      Past Medical History:  Diagnosis Date   Hypertension     Past Surgical History:  Procedure Laterality Date   TONSILLECTOMY      Social History   Socioeconomic History   Marital status: Married    Spouse name: Not on file   Number of children: 2   Years of education: Not on file   Highest education level: Not on file  Occupational History   Not on file  Tobacco Use   Smoking status: Never   Smokeless tobacco: Not on file  Substance and Sexual Activity    Alcohol use: Yes    Comment: Occasional   Drug use: Not on file   Sexual activity: Not on file  Other Topics Concern   Not on file  Social History Narrative   Not on file   Social Drivers of Health   Financial Resource Strain: Not on file  Food Insecurity: Not on file  Transportation Needs: Not on file  Physical Activity: Not on file  Stress: Not on file  Social Connections: Unknown (02/08/2022)   Received from [facility]   Social Network    Social Network: Not on file  Intimate Partner Violence: Not At Risk (05/09/2024)   Received from [facility]   HITS    Over the last 12 months how often did your partner physically hurt you?: Never    Over the last 12 months how often did your partner insult you or talk down to you?: Never    Over the last 12 months how often did your partner threaten you with physical harm?: Never    Over the last 12 months how often did your partner scream or curse at you?: Never    Family History  Problem Relation Age of Onset   Alzheimer's disease Mother    Cancer Father     ROS: no fevers or chills, productive cough, hemoptysis, dysphasia, odynophagia, melena, hematochezia, dysuria, hematuria, rash,  seizure activity, orthopnea, PND, pedal edema, claudication. Remaining systems are negative.  Physical Exam:   Blood pressure (!) 140/58, pulse 85, height 5' 6 (1.676 m), weight 158 lb (71.7 kg), SpO2 97%.  General:  Well developed/well nourished in NAD Skin warm/dry Patient not depressed No peripheral clubbing Back-normal HEENT-normal/normal eyelids Neck supple/normal carotid upstroke bilaterally; no bruits; no JVD; no thyromegaly chest - CTA/ normal expansion CV - RRR/normal S1 and S2; no murmurs, rubs or gallops;  PMI nondisplaced Abdomen -NT/ND, no HSM, no mass, + bowel sounds, no bruit 2+ femoral pulses, no bruits Ext-no edema, chords, 2+ DP Neuro-grossly nonfocal  EKG Interpretation Date/Time:  Wednesday August 28 2024 11:03:16  EST Ventricular Rate:  85 PR Interval:  152 QRS Duration:  78 QT Interval:  362 QTC Calculation: 430 R Axis:   63  Text Interpretation: Normal sinus rhythm Confirmed by Pietro Rogue ([id number]) on 08/28/2024 11:35:45 AM    A/P  1 prior syncopal episode-no episodes since August.  She does have orthostatic symptoms.  I will decrease amlodipine from 10 to 5 mg daily to allow her blood pressure to run higher and hopefully avoid the symptoms.  We discussed the importance of maintaining hydration.  Will arrange echocardiogram to assess LV function.  Also note some of the description of her event in August sounds potentially vagal mediated.  2 hypertension-blood pressure is borderline.  I am decreasing amlodipine to allow her blood pressure to run higher to hopefully avoid orthostatic symptoms.  3 hyperlipidemia-continue statin.  Rogue Pietro, MD

## 2024-08-28 ENCOUNTER — Encounter: Payer: Self-pay | Admitting: Cardiology

## 2024-08-28 ENCOUNTER — Ambulatory Visit: Attending: Cardiology | Admitting: Cardiology

## 2024-08-28 VITALS — BP 140/58 | HR 85 | Ht 66.0 in | Wt 158.0 lb

## 2024-08-28 DIAGNOSIS — R55 Syncope and collapse: Secondary | ICD-10-CM

## 2024-08-28 MED ORDER — AMLODIPINE BESYLATE 5 MG PO TABS: 5.0000 mg | ORAL_TABLET | Freq: Every day | ORAL | 3 refills | Status: AC

## 2024-08-28 NOTE — Patient Instructions (Signed)
 Medication Instructions:   DECREASE AMLODIPINE TO 5 MG ONCE DAILY= 1/2 OF THE 10 MG TABLET ONCE DAILY  *If you need a refill on your cardiac medications before your next appointment, please call your pharmacy*  Testing/Procedures:  Your physician has requested that you have an echocardiogram. Echocardiography is a painless test that uses sound waves to create images of your heart. It provides your doctor with information about the size and shape of your heart and how well your heart's chambers and valves are working. This procedure takes approximately one hour. There are no restrictions for this procedure. Please do NOT wear cologne, perfume, aftershave, or lotions (deodorant is allowed). Please arrive 15 minutes prior to your appointment time.  Please note: We ask at that you not bring children with you during ultrasound (echo/ vascular) testing. Due to room size and safety concerns, children are not allowed in the ultrasound rooms during exams. Our front office staff cannot provide observation of children in our lobby area while testing is being conducted. An adult accompanying a patient to their appointment will only be allowed in the ultrasound room at the discretion of the ultrasound technician under special circumstances. We apologize for any inconvenience. [facility]  Follow-Up: At [facility], you and your health needs are our priority.  As part of our continuing mission to provide you with exceptional heart care, our providers are all part of one team.  This team includes your primary Cardiologist (physician) and Advanced Practice Providers or APPs (Physician Assistants and Nurse Practitioners) who all work together to provide you with the care you need, when you need it.  Your next appointment:   8 week(s)  Provider:   One of our Advanced Practice Providers (APPs): Morse Clause, PA-C  Lamarr Satterfield, NP Miriam Shams, NP  Olivia Pavy, PA-C Josefa Beauvais,  NP  Leontine Salen, PA-C Orren Fabry, PA-C  [place], PA-C Ernest Dick, NP  Damien Braver, NP Jon Hails, PA-C  Waddell Donath, PA-C    Dayna Dunn, PA-C  Scott Weaver, PA-C Lum Louis, NP Katlyn West, NP Callie Goodrich, PA-C  Xika Zhao, NP Sheng Haley, PA-C    Kathleen Johnson, PA-C   Then, None will plan to see you again in 6 month(s). [place]

## 2024-09-23 ENCOUNTER — Ambulatory Visit (HOSPITAL_BASED_OUTPATIENT_CLINIC_OR_DEPARTMENT_OTHER)
Admission: RE | Admit: 2024-09-23 | Discharge: 2024-09-23 | Disposition: A | Discharge status: Home / Self Care | Source: Ambulatory Visit | Attending: Cardiology | Admitting: Cardiology

## 2024-09-23 DIAGNOSIS — R55 Syncope and collapse: Secondary | ICD-10-CM | POA: Insufficient documentation

## 2024-09-24 ENCOUNTER — Ambulatory Visit: Payer: Self-pay | Admitting: Cardiology

## 2024-09-24 DIAGNOSIS — R55 Syncope and collapse: Secondary | ICD-10-CM

## 2024-09-24 LAB — ECHOCARDIOGRAM COMPLETE
AR max vel: 1.84 cm2
AV Area VTI: 1.85 cm2
AV Area mean vel: 1.77 cm2
AV Mean grad: 4 mmHg
AV Peak grad: 7 mmHg
AV Vena cont: 0.3 cm
Ao pk vel: 1.32 m/s
Area-P 1/2: 4.57 cm2
Calc EF: 54.9 %
MV M vel: 4.22 m/s
MV Peak grad: 71.2 mmHg
S' Lateral: 2.8 cm
Single Plane A2C EF: 57.3 %
Single Plane A4C EF: 54.6 %

## 2024-10-24 ENCOUNTER — Ambulatory Visit: Admitting: Physician Assistant
